# Patient Record
Sex: Female | Born: 1935 | Race: White | Hispanic: Yes | Smoking: Never smoker
Health system: Southern US, Community
[De-identification: ages and names within clinical notes are randomized; demographics above are authoritative.]

## PROBLEM LIST (undated history)

## (undated) HISTORY — PX: TONSILLECTOMY AND ADENOIDECTOMY: SHX28

## (undated) HISTORY — PX: TONSILLECTOMY: SUR1361

## (undated) HISTORY — PX: APPENDECTOMY: SHX54

---

## 1997-11-28 ENCOUNTER — Ambulatory Visit (HOSPITAL_COMMUNITY): Admission: RE | Admit: 1997-11-28 | Discharge: 1997-11-28 | Payer: Self-pay | Admitting: Obstetrics & Gynecology

## 2000-01-24 ENCOUNTER — Encounter: Admission: RE | Admit: 2000-01-24 | Discharge: 2000-01-24 | Payer: Self-pay | Admitting: Family Medicine

## 2000-01-24 ENCOUNTER — Encounter: Payer: Self-pay | Admitting: Family Medicine

## 2000-02-05 ENCOUNTER — Encounter: Payer: Self-pay | Admitting: Family Medicine

## 2000-02-05 ENCOUNTER — Encounter: Admission: RE | Admit: 2000-02-05 | Discharge: 2000-02-05 | Payer: Self-pay | Admitting: Family Medicine

## 2000-02-07 ENCOUNTER — Encounter: Admission: RE | Admit: 2000-02-07 | Discharge: 2000-02-07 | Payer: Self-pay | Admitting: Family Medicine

## 2000-02-07 ENCOUNTER — Encounter: Payer: Self-pay | Admitting: Family Medicine

## 2000-02-20 ENCOUNTER — Ambulatory Visit (HOSPITAL_COMMUNITY): Admission: RE | Admit: 2000-02-20 | Discharge: 2000-02-20 | Payer: Self-pay | Admitting: *Deleted

## 2000-03-13 ENCOUNTER — Ambulatory Visit (HOSPITAL_COMMUNITY): Admission: RE | Admit: 2000-03-13 | Discharge: 2000-03-13 | Payer: Self-pay | Admitting: *Deleted

## 2001-08-26 ENCOUNTER — Other Ambulatory Visit: Admission: RE | Admit: 2001-08-26 | Discharge: 2001-08-26 | Payer: Self-pay | Admitting: *Deleted

## 2005-04-16 ENCOUNTER — Encounter: Admission: RE | Admit: 2005-04-16 | Discharge: 2005-04-16 | Payer: Self-pay | Admitting: Family Medicine

## 2005-05-28 ENCOUNTER — Encounter: Admission: RE | Admit: 2005-05-28 | Discharge: 2005-05-28 | Payer: Self-pay | Admitting: *Deleted

## 2006-04-13 ENCOUNTER — Ambulatory Visit: Payer: Self-pay | Admitting: Family Medicine

## 2006-06-05 ENCOUNTER — Ambulatory Visit: Payer: Self-pay | Admitting: Family Medicine

## 2006-06-29 ENCOUNTER — Ambulatory Visit: Payer: Self-pay | Admitting: Family Medicine

## 2006-07-13 ENCOUNTER — Ambulatory Visit: Payer: Self-pay | Admitting: Family Medicine

## 2006-11-02 ENCOUNTER — Ambulatory Visit: Payer: Self-pay | Admitting: Family Medicine

## 2007-03-01 ENCOUNTER — Ambulatory Visit: Payer: Self-pay | Admitting: Family Medicine

## 2007-04-29 ENCOUNTER — Ambulatory Visit: Payer: Self-pay | Admitting: Family Medicine

## 2007-07-15 ENCOUNTER — Ambulatory Visit: Payer: Self-pay | Admitting: Family Medicine

## 2007-09-28 ENCOUNTER — Ambulatory Visit: Payer: Self-pay | Admitting: Family Medicine

## 2007-10-27 ENCOUNTER — Ambulatory Visit: Payer: Self-pay | Admitting: Family Medicine

## 2007-11-22 ENCOUNTER — Ambulatory Visit: Payer: Self-pay | Admitting: Family Medicine

## 2007-11-23 ENCOUNTER — Encounter: Admission: RE | Admit: 2007-11-23 | Discharge: 2007-11-23 | Payer: Self-pay | Admitting: Family Medicine

## 2007-12-24 ENCOUNTER — Ambulatory Visit: Payer: Self-pay | Admitting: Family Medicine

## 2007-12-24 ENCOUNTER — Encounter: Admission: RE | Admit: 2007-12-24 | Discharge: 2007-12-24 | Payer: Self-pay | Admitting: Family Medicine

## 2008-03-27 ENCOUNTER — Ambulatory Visit: Payer: Self-pay | Admitting: Family Medicine

## 2008-05-05 ENCOUNTER — Ambulatory Visit: Payer: Self-pay | Admitting: Family Medicine

## 2008-06-19 ENCOUNTER — Ambulatory Visit: Payer: Self-pay | Admitting: Family Medicine

## 2009-03-15 ENCOUNTER — Ambulatory Visit: Payer: Self-pay | Admitting: Family Medicine

## 2009-12-16 IMAGING — CT CT HEAD WO/W CM
1 of 2 series · 13 of 30 positions shown, 17 images · IV contrast (agent unspecified)
Comparison: None.

CLINICAL DATA: 89-year-old female with double vision status post
fall on 11/18/2007 in which she struck her left frontal area.

CT HEAD WITHOUT AND WITH CONTRAST
TECHNIQUE: Contiguous axial images were obtained from the base of
the skull through the vertex without and with intravenous contrast.
Contrast: 75 ml [id number].

[Series 32: 3d filtered head w/o · axial · non-contrast · 0.45mm/px · z∈[+19,+145]mm · 13 of 28 slices shown, 17 images]
[im 2/28  brain]
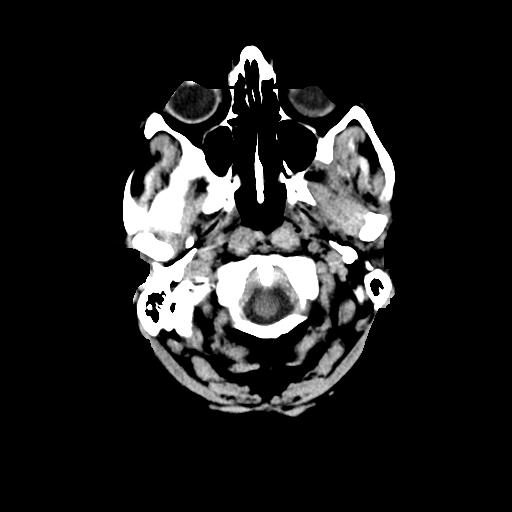
[im 2/28  bone]
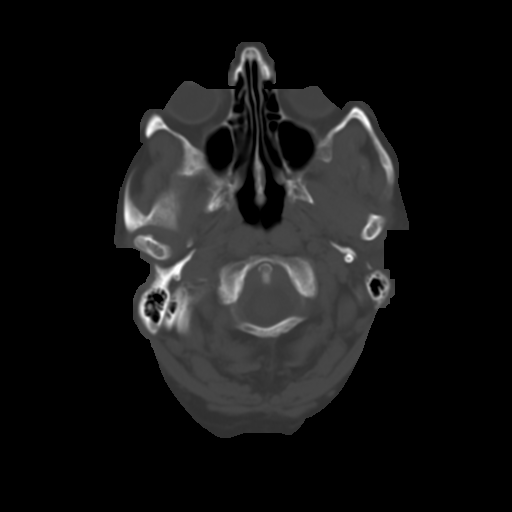
[im 4/28  brain]
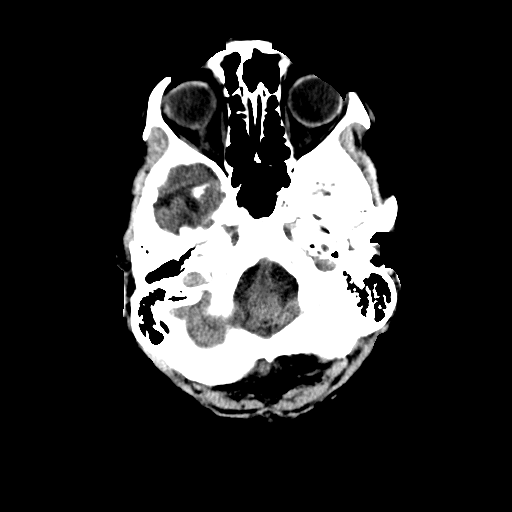
[im 6/28  brain]
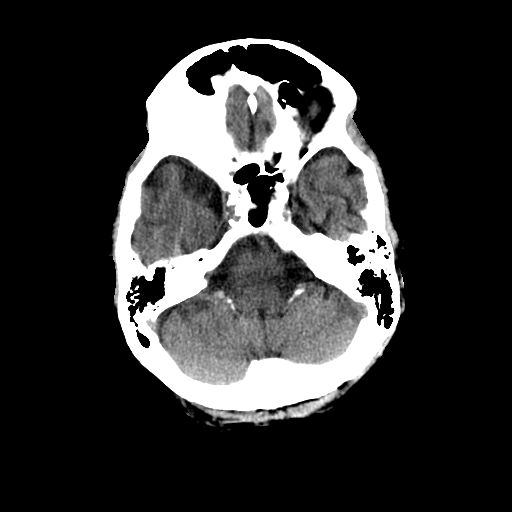
[im 8/28  brain]
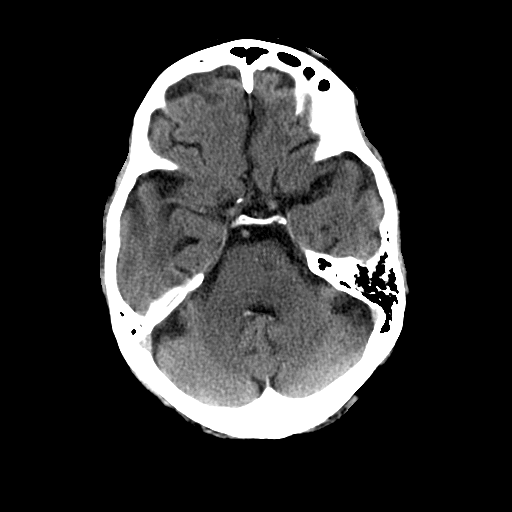
[im 10/28  brain]
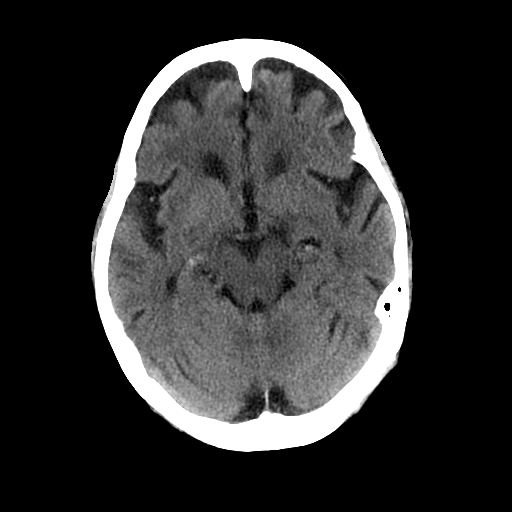
[im 10/28  bone]
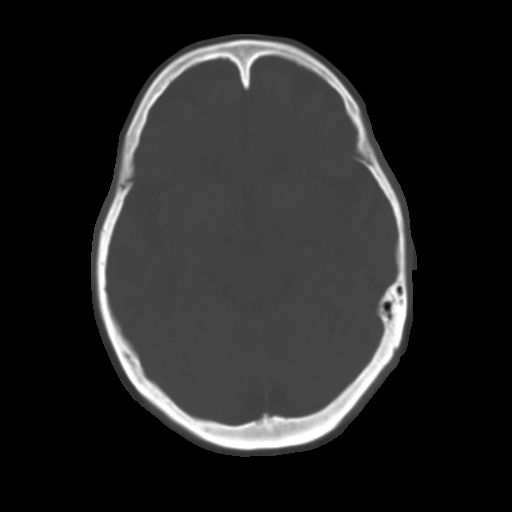
[im 12/28  brain]
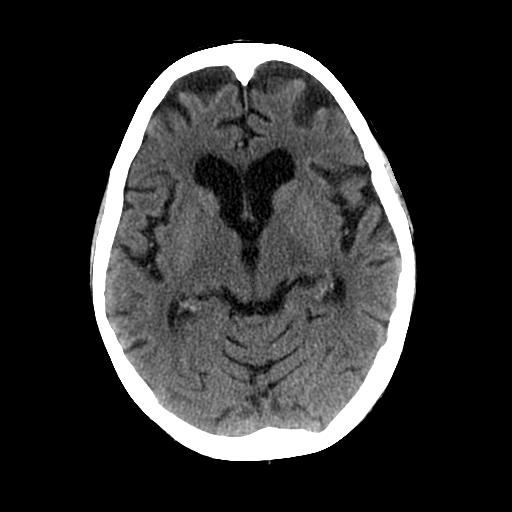
[im 14/28  brain]
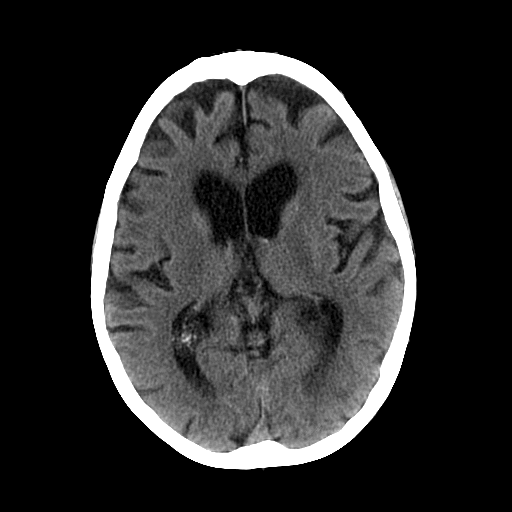
[im 16/28  brain]
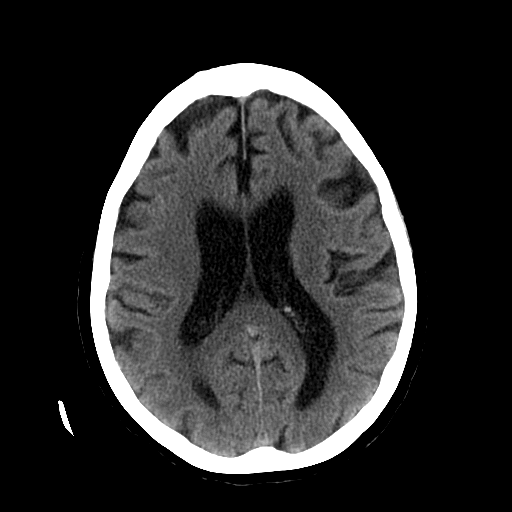
[im 18/28  brain]
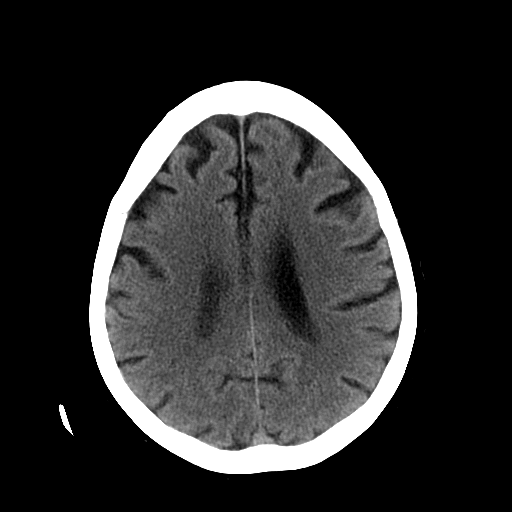
[im 18/28  bone]
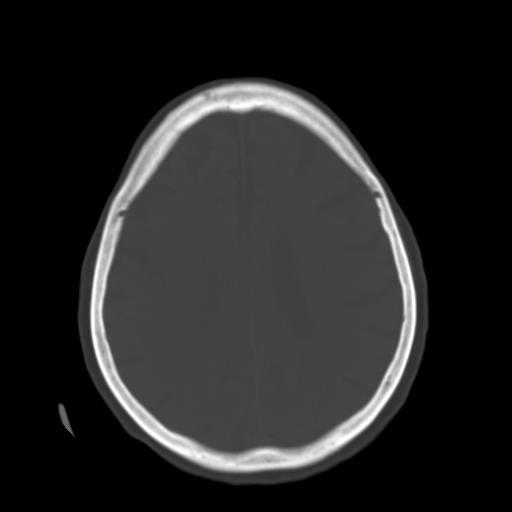
[im 20/28  brain]
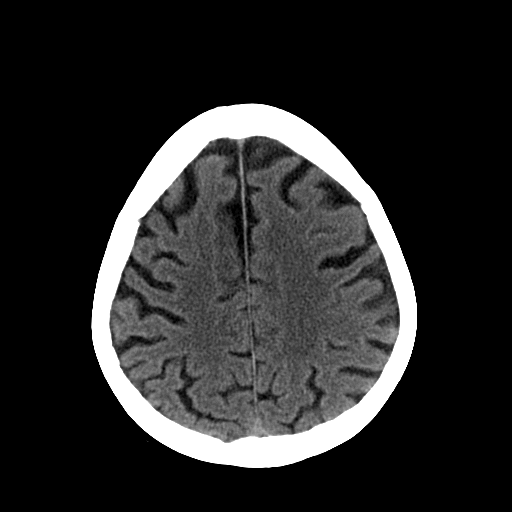
[im 22/28  brain]
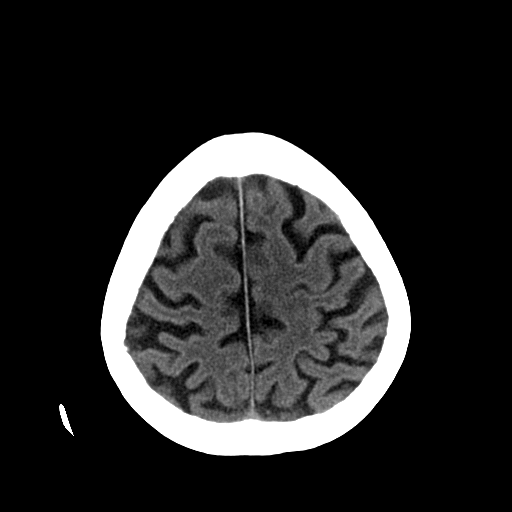
[im 24/28  brain]
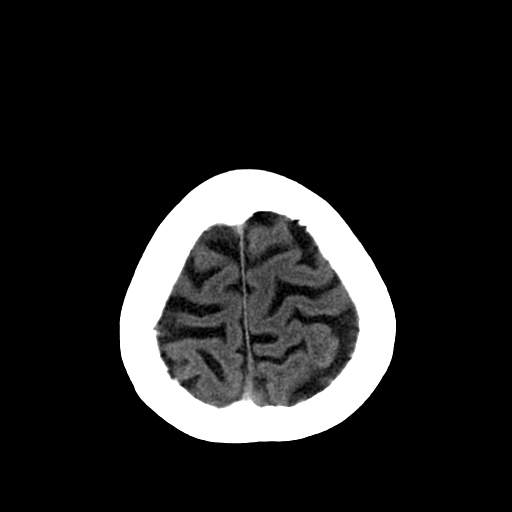
[im 26/28  brain]
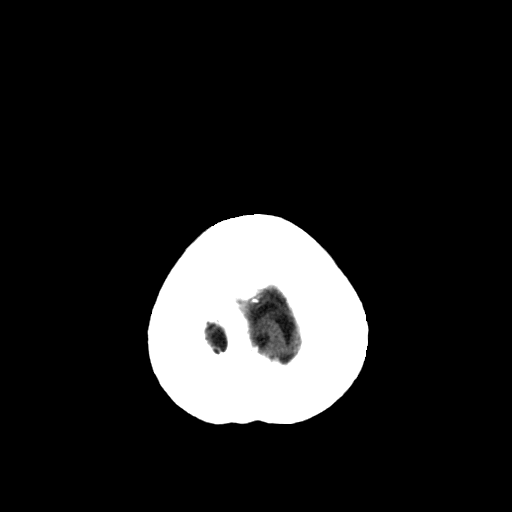
[im 26/28  bone]
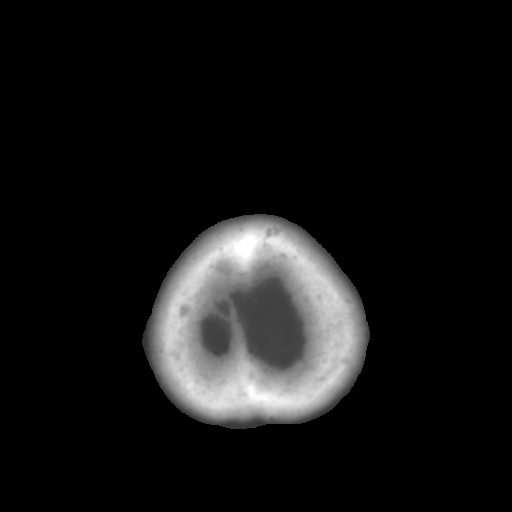

[13 of 30 positions shown; findings below may reference images not displayed]

FINDINGS: Postoperative changes to the globes.  The gaze appears
conjugate on these images.  The visualized scalp soft tissues are
within normal limits, no focal scalp hematoma is identified.
Visualized paranasal sinuses are clear.  Visualized mastoids are
clear.  No acute osseous abnormality identified.

Normal cerebral volume for age without midline shift,
ventriculomegaly, mass effect, acute intracranial hemorrhage or
evidence of acute cortically based infarct.  Chronic carotid siphon
vascular calcifications.  Mild hypodensity in the cerebral white
matter compatible with chronic small vessel disease.  Otherwise
normal gray-white matter differentiation throughout the brain.  No
abnormal enhancement.
IMPRESSION: 1.  No acute traumatic injury identified.
2.  Unremarkable brain for age.

## 2009-12-28 ENCOUNTER — Ambulatory Visit: Payer: Self-pay | Admitting: Family Medicine

## 2010-01-24 ENCOUNTER — Ambulatory Visit: Payer: Self-pay | Admitting: Family Medicine

## 2011-01-10 NOTE — Op Note (Signed)
[place]  Patient:    Gabrielle Simmons, Gabrielle Simmons                  MRN: [id number] Proc. Date: 03/13/00 Adm. Date:  [id number] Disc. Date: [id number] Attending:  Sharyn Dross CC:         Ronnald Nian, M.D.                           Operative Report  REFERRING PHYSICIAN:  Ronnald Nian, M.D.  PREOPERATIVE DIAGNOSES: 1.  Abdominal pains. 2.  Family history of colon cancer.  POSTOPERATIVE DIAGNOSIS:  Tortuous colon but normal to the cecal region.  PROCEDURE:  Colonoscopy.  MEDICATION:  Demerol 50 mg IV, Versed 2.5 mg IV over 10 minute period of time.  INSTRUMENTS:  The Olympus video pan colonoscope.  ENDOSCOPIST:  Sharyn Dross., M.D.  INDICATIONS:  This pleasant 77 year old female was referred for an evaluation because of specific abdominal and pelvic pain that is present.  The pains appear to be in the infraumbilical region that was present.  She has had a workup from the urology standpoint, which was negative.  She was subsequently referred to rule out any evidence of GI problems.  She denies any history of any bleeding per rectum at this time.  She states that her mother has had colon cancer many years ago.  She has not been checked in the past, and because of that history, the patient was brought in for the colonoscopic examination.  OBJECTIVE FINDINGS:  She is a pleasant female who appears to be in no acute distress today.  Her vital signs are stable.  Her HEENT examination is anicteric.  The neck is supple.  Lungs are clear.  Heart has a regular rate and rhythm without heaves, thrills, murmurs, rubs or gallops.  The abdomen was soft, mild tenderness, generalized, especially in the infraumbilical region on the right side and left.  No rebound or referred tenderness is noted at this time.  Digital rectal exam was normal.  Extremities showed no cyanosis, clubbing or edema.  PLAN:  Were going to proceed with the colonoscopic  examination.  INFORMED CONSENT:  The patient was advised of the procedure, indication and risks involved.  She has agreed to have the procedure performed.  Video was reviewed and consent form obtained.  PREOPERATIVE PREPARATION:  The patient was brought to the endoscopy unit where an IV for IV sedative medication was started.  A monitor was placed on the patient to monitor the patients vital signs and oxygen saturation.  Nasal oxygen at 2 liters per minute was used, sedation was performed, and the procedure was begun.  The patient was given GoLYTELY and Reglan as a bowel prep.  The patient tolerated the prep well without any complications.  The quality of the prep was good to excellent.  PROCEDURE IN DETAIL:  The instrument was advanced with the patient lying in the left lateral position approximately 95 cm proximal colon to the cecal region.  This was confirmed by palpation as well as visualization of the ileocecal valve.  There appeared to be no gross abnormality such as masses, polyps, or stricture lesions appreciated.  The vasculature of the abdomen appeared to be well within normal limits throughout the entire colon.  The mucosal pattern showed no evidence of any diverticular changes or any granular changes that were noted at this time.  There was increased tortuosity in advancing  the instrument, especially in the rectosigmoid and descending colon at this time.  However, with manipulation of the instrument I was able to traverse all the way to the cecal region with no difficulty at this time.  There was evidence of increased hyperemia around the anal verge upon exiting from the area, but no direct overt hemorrhoids that were appreciated at this time.  The instrument was subsequently removed and the patient tolerated the procedure without any complications.  TREATMENT: 1.  Conservative management at this time. 2.  Agree with the high fiber that she is presently taking at  this time. 3.  Will have followup in the office at this point. 4.  We would recommend to repeat the examination in approximately two to     possibly three years. DD:  03/13/00 TD:  03/15/00 Job: [id number]

## 2013-08-12 ENCOUNTER — Encounter: Payer: Self-pay | Admitting: Podiatrist

## 2013-08-12 ENCOUNTER — Ambulatory Visit: Payer: Self-pay | Admitting: Podiatrist

## 2013-08-12 ENCOUNTER — Ambulatory Visit (INDEPENDENT_AMBULATORY_CARE_PROVIDER_SITE_OTHER): Payer: Medicare Other | Admitting: Podiatrist

## 2013-08-12 VITALS — BP 130/74 | HR 74 | Resp 12

## 2013-08-12 DIAGNOSIS — M79609 Pain in unspecified limb: Secondary | ICD-10-CM

## 2013-08-12 DIAGNOSIS — B351 Tinea unguium: Secondary | ICD-10-CM

## 2013-08-16 NOTE — Progress Notes (Signed)
HPI:  Patient presents today for follow up of foot and nail care. Denies any new complaints today.  Objective:  Patients chart is reviewed.  Neurovascular status unchanged.  Patients nails are thickened, discolored, distrophic, friable and brittle with yellow-brown discoloration. Patient subjectively relates they are painful with shoes and with ambulation of bilateral feet.  Assessment:  Symptomatic onychomycosis  Plan:  Discussed treatment options and alternatives.  The symptomatic toenails were debrided through manual an mechanical means without complication.  Return appointment recommended at routine intervals of 3 months    Kathryn Egerton, DPM   

## 2015-09-13 ENCOUNTER — Encounter (HOSPITAL_COMMUNITY): Payer: Self-pay | Admitting: Emergency Medicine

## 2015-09-13 ENCOUNTER — Emergency Department (HOSPITAL_COMMUNITY)
Admission: EM | Admit: 2015-09-13 | Discharge: 2015-09-13 | Disposition: A | Discharge status: Home / Self Care | Payer: Medicare Other | Attending: Emergency Medicine | Admitting: Emergency Medicine

## 2015-09-13 ENCOUNTER — Emergency Department (HOSPITAL_COMMUNITY): Payer: Medicare Other

## 2015-09-13 DIAGNOSIS — Y998 Other external cause status: Secondary | ICD-10-CM | POA: Diagnosis not present

## 2015-09-13 DIAGNOSIS — S42491A Other displaced fracture of lower end of right humerus, initial encounter for closed fracture: Secondary | ICD-10-CM | POA: Insufficient documentation

## 2015-09-13 DIAGNOSIS — S4991XA Unspecified injury of right shoulder and upper arm, initial encounter: Secondary | ICD-10-CM | POA: Diagnosis present

## 2015-09-13 DIAGNOSIS — W1839XA Other fall on same level, initial encounter: Secondary | ICD-10-CM | POA: Diagnosis not present

## 2015-09-13 DIAGNOSIS — Z79899 Other long term (current) drug therapy: Secondary | ICD-10-CM | POA: Diagnosis not present

## 2015-09-13 DIAGNOSIS — Y9389 Activity, other specified: Secondary | ICD-10-CM | POA: Diagnosis not present

## 2015-09-13 DIAGNOSIS — Y9289 Other specified places as the place of occurrence of the external cause: Secondary | ICD-10-CM | POA: Diagnosis not present

## 2015-09-13 DIAGNOSIS — S42401A Unspecified fracture of lower end of right humerus, initial encounter for closed fracture: Secondary | ICD-10-CM

## 2015-09-13 MED ORDER — OXYCODONE-ACETAMINOPHEN 5-325 MG PO TABS
1.0000 | ORAL_TABLET | Freq: Once | ORAL | Status: AC
  Administered 2015-09-13: 1 via ORAL
  Filled 2015-09-13: qty 1

## 2015-09-13 MED ORDER — OXYCODONE-ACETAMINOPHEN 5-325 MG PO TABS: 1.0000 | ORAL_TABLET | Freq: Four times a day (QID) | ORAL | Status: DC | PRN

## 2015-09-13 NOTE — ED Notes (Signed)
Pt d/ced and waiting ptar for tx to residence

## 2015-09-13 NOTE — ED Notes (Signed)
[facility] called for transport.  

## 2015-09-13 NOTE — ED Notes (Signed)
PA at bedside.

## 2015-09-13 NOTE — Progress Notes (Addendum)
09/13/15 0000  CM Assessment  Expected Discharge Plan Home w Home Health Services  Discharge Planning Services CM Consult  PAC Choice Durable Medical Equipment;Home Health  Choice offered to / list presented to  Patient;Adult Children  DME Arranged [id number];Wheelchair manual  DME Agency [facility]  HH Arranged RN;PT  HH Agency [facility]  Status of Service Completed, signed off  Discharge Disposition Home w Home Health Services    Cm noted Cm consult  Cm met with pt and daughter at bedside Daughter is primary caregiver whom pt lives with Pt has only cane and walker at home Pt is right hand dominant which is the injured arm therefore agreed to a wheelchair and 3N1 Has transverse supracondylar fracture of the distal humerus with an associated elbow joint effusion  Daughter interested in services to assist Agreed to home health services Choice is [facility] because they have used them before Reports pt is seen by doctors making house calls  Cm also discussed community transportation options but unable to find local transportation services to come in to get pt to walk her down steps Richard from [facility] [place] was consulted who states many of the PCS (personal care services) agencies like [employer] and home watch care CNAs are trained to assist pt out and in homes using stairs Daughter updated  Daughter provided information on [facility] [place], PDN and HH agencies  CM reviewed in details medicare guidelines, home health (HH) (length of stay in home, types of HH staff available, coverage, primary caregiver, up to 24 hrs before services may be started) and Private duty nursing (PDN-coverage, length of stay in the home types of staff available).   CM left her office Number for daughter to call if issues  Daughter wants w/c ans 3n1 delivered to the home  CM left message for Tim of [facility] for referral for HHRN/HHPT  Cm spoke with Samantha ED NP/PA about  need for face to face and pt d/c plans agreed upon by pt and Dtr    

## 2015-09-13 NOTE — Progress Notes (Signed)
  Patient suffers from transverse supracondylar fracture of the distal humerus with an associated elbow joint effusion which impairs their ability to perform daily activities like mobility, eating, bathing and dressing in the home.  A cane, crutch or walker will not resolve  issue with performing activities of daily living since she is right hand dominant and has right hand injury. A wheelchair will allow patient to safely perform daily activities. Patient can safely propel the wheelchair in the home or has a caregiver who can provide assistance.  Accessories: elevating leg rests (ELRs), wheel locks, extensions and anti-tippers.

## 2015-09-13 NOTE — ED Notes (Signed)
Pt's daughter noted to be asking Adriana Simas MD about getting the Pt getting "an ambulance faster."  Daughter informed that the EDPs do not deal w/ Pt transport and that they would be here asap.  Additionally, informed that it could be a long wait.

## 2015-09-13 NOTE — ED Provider Notes (Signed)
CSN: [id number]     Arrival date & time 09/13/15  1428 History   First MD Initiated Contact with Patient 09/13/15 1510     Chief Complaint  Patient presents with  . Arm Pain   HPI  Gabrielle Simmons is a 82 y.o. F presenting s/p mechanical fall last night around 9 pm. EMS was called but the patient refused transport. This morning, she woke up with right arm pain. She describes the pain as discomfort, elbow in location, non-radiating, 6/10 pain scale, constant, worsened with movement. No alleviation attempts. No LOC, head injury, HA, CP, abdominal pain, change in bowel/bladder habits, bowel/bladder incontinence.   History provided by daughter, present at bedside.  History reviewed. No pertinent past medical history. Past Surgical History  Procedure Laterality Date  . Cesarean section    . Appendectomy    . Tonsillectomy    . Tonsillectomy and adenoidectomy     History reviewed. No pertinent family history. Social History  Substance Use Topics  . Smoking status: Never Smoker   . Smokeless tobacco: None  . Alcohol Use: No   OB History    No data available     Review of Systems  Ten systems are reviewed and are negative for acute change except as noted in the HPI  Allergies  Review of patient's allergies indicates no known allergies.  Home Medications   Prior to Admission medications   Medication Sig Start Date End Date Taking? Authorizing Provider  escitalopram (LEXAPRO) 10 MG tablet  08/09/13   Historical Provider, MD  Naproxen Sodium (ALEVE PO) Take by mouth.    Historical Provider, MD  oxybutynin (DITROPAN-XL) 5 MG 24 hr tablet  08/11/13   Historical Provider, MD  Vitamin D, Ergocalciferol, (DRISDOL) 50000 UNITS CAPS capsule  08/06/13   Historical Provider, MD   BP 144/59 mmHg  Pulse 70  Temp(Src) 98.6 F (37 C) (Oral)  Resp 20  SpO2 96% Physical Exam  Constitutional: She appears well-developed and well-nourished. No distress.  HENT:  Head: Normocephalic and  atraumatic.  Mouth/Throat: Oropharynx is clear and moist. No oropharyngeal exudate.  Eyes: Conjunctivae are normal. Pupils are equal, round, and reactive to light. Right eye exhibits no discharge. Left eye exhibits no discharge. No scleral icterus.  Neck: No tracheal deviation present.  Cardiovascular: Normal rate, regular rhythm, normal heart sounds and intact distal pulses.  Exam reveals no gallop and no friction rub.   No murmur heard. Pulmonary/Chest: Effort normal and breath sounds normal. No respiratory distress. She has no wheezes. She has no rales. She exhibits no tenderness.  Abdominal: Soft. Bowel sounds are normal. She exhibits no distension and no mass. There is no tenderness. There is no rebound and no guarding.  Musculoskeletal: She exhibits edema and tenderness.  Slight edema surrounding right elbow. No ecchymosis. ROM limited at right arm due to pain. Neurovascularly intact BL.   Lymphadenopathy:    She has no cervical adenopathy.  Neurological: She is alert. Coordination normal.  Skin: Skin is warm and dry. No rash noted. She is not diaphoretic. No erythema.  Psychiatric: She has a normal mood and affect. Her behavior is normal.  Nursing note and vitals reviewed.   ED Course  Procedures  Imaging Review Dg Humerus Right  09/13/2015  CLINICAL DATA:  Right arm pain following a fall at home last night. EXAM: RIGHT HUMERUS - 2+ VIEW COMPARISON:  None. FINDINGS: Diffuse osteopenia. Transverse supracondylar fracture of the distal humerus with anterior angulation of the distal fragment.  There is an associated elbow joint effusion. Right shoulder degenerative changes. IMPRESSION: Transverse supracondylar fracture of the distal humerus with an associated elbow joint effusion. Electronically Signed   By: Beckie Salts M.D.   On: 09/13/2015 14:57   I have personally reviewed and evaluated these images and lab results as part of my medical decision-making.  MDM   Final diagnoses:   Humerus distal fracture, right, closed, initial encounter   Patient non-toxic appearing and VSS.  Right humerus xray performed prior to my evaluation, which demonstrates a transverse supracondylar fracture of the right distal humerus with associated elbow joint effusion. No further imaging indicated at this time.   I explained the results of the xray to the patient, and her daughter, and the daughter informed me that the patient will not be able to go to an ortho follow-up because she has difficulty ambulating outside of the home. She states her PCP comes to the home to evaluate Gabrielle Simmons. She also informed me that the patient is a DNR and would not agree to ortho surgery. I explained that ortho would most likely advise surgery. My plan is to treat her pain, splint for comfort, and consult case management. Dr. Adriana Simas evaluated patient as well and agrees with my plan.  Patient with no pain on reassessment. Case management provided patient with resources. Patient may be safely discharged home with percocet. Discussed reasons for return. Patient to call ortho tomorrow, if possible. Patient should follow-up with PCP as well. Patient's daughter in understanding and agreement with the plan.  Melton Krebs, PA-C 09/15/15 1445  Donnetta Hutching, MD 09/15/15 [phone]

## 2015-09-13 NOTE — ED Notes (Signed)
[facility] called for Pt 

## 2015-09-13 NOTE — ED Notes (Signed)
P-tar here to transport pt home.  Called pt's daughter, Byrd Hesselbach, [phone], left message pt is on her way home, P-tar has arrived.

## 2015-09-13 NOTE — ED Notes (Signed)
Per EMS: Pt from home.  Last night, she had a fall around 9pm.  Called 911.  Refused transport.  Woke up with rt arm pain.  Home health came.  Did a portable xray and found a fx to distal humerus.

## 2015-09-24 ENCOUNTER — Telehealth: Payer: Self-pay | Admitting: *Deleted

## 2019-11-15 ENCOUNTER — Telehealth: Payer: Self-pay | Admitting: Internal Medicine

## 2019-11-15 NOTE — Telephone Encounter (Signed)
Phone call placed to patient to offer to schedule a visit with [facility]. Phone rang, with no answer I left a voicemail for call back. 

## 2019-12-06 ENCOUNTER — Telehealth: Payer: Self-pay

## 2019-12-06 NOTE — Telephone Encounter (Signed)
Phone call placed to patient to introduce Palliative Care and offer to schedule visit with NP. VM left with callback information

## 2019-12-12 ENCOUNTER — Telehealth: Payer: Self-pay

## 2019-12-12 NOTE — Telephone Encounter (Signed)
Call placed to patient. Spoke with daughter, Gabrielle Simmons, to introduce Palliative Care. Daughter receptive to scheduling visit. Verbal consent obtained. Visit scheduled for Monday 12/19/19 @ 3pm. Address verified.

## 2019-12-18 NOTE — Progress Notes (Addendum)
April 26th, 2021 [facility]iative [facility] Consult Note Telephone: [phone]  Fax: [phone]  PATIENT NAME: Gabrielle Simmons DOB: 1918-01-20 MRN: [id number]  [address]  [place] [id number]  PRIMARY CARE PROVIDER:   Florentina Jenny, MD  REFERRING PROVIDER:  Florentina Jenny, MD [phone] [place],  [place] [address]  RESPONSIBLE PARTY:  Ashok Cordia  [phone] [facility] Phone)   ASSESSMENT / RECOMMENDATIONS:  1. Advance Care Planning: A. Directives: Discussed with daughter/HCPOA  Byrd Hesselbach. No desire for cardiopulmonary resuscitation in the event of a Cardiopulmonary arrest. DNR completed and 2 copies left in the home. Form uploaded into [facility] [employer]. B. Goals of Care: Avoid hospitalizations. Maximize comfort happiness.  2. Symptom Management: Patient is A & O x 3, and cognizant of current events. Daughter notes patient now speaks almost exclusively her native [place], rather than [place]. She has experienced functional decline over the last month. She can be agitated with attempts to provide some personal care such as nail and toenail clipping. She is fearful when crossing the threshold into the bathroom; this is where she fell years ago and fractured her elbow. Patient is fearful of traveling outside the home. Patient now needing assist to transfer. She needs guarding assist when ambulation with her walker. She is now incontinent of both bowel and bladder. Increased somnolence such that she is awake only about 5 hours/day. She is dependent for hygiene, dressing, and toileting.  Her appetite has declined; consumes average of 50% of two meals/day, with an ensure in the evenings. Daughter more often now assisting patient with feeding her meals. Height 5". Her last measured weight last year was around160lbs.Daughter feels patient has lost at least 10 lbs, with noticeable facial and upper extremity adipose/muscular wasting.   Arthritic pain in  her lower back/shoulders/knees well managed with prn Tylenol.  Daughter relates episode when, upon returning from the market daughter discovered patient sitting at the table with her head on the table surface. Patient related she fell, and a neighbor assisted her back to her seat. Daughter not able to validate if this happened, or was a delusion.   3. Family Supports: Patient is widowed since 01/01/65, shortly after arriving to the [place] from [place]. 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 She has lived with her daughter Byrd Hesselbach since 2004/01/02. Patient's son is deceased.   Patient receives a shower twice a week from a privately hired aide.  Daughter requesting contact information for Meals on Wheels. I provided her with the [facility] 211 number to resource.   4. Follow up Palliative Care Visit: Daughter will call me on a prn basis, signs of further patient decline. I will check with Dr. Gibson Ramp [facility] physician) if patient has shown enough signs of decline over this last month, for consideration of hospice eligibility.  I spent 60 minutes providing this consultation from 3pm to 4pm. More than 50% of the time in this consultation was spent coordinating communication.   HISTORY OF PRESENT ILLNESS:  Gabrielle DENUNZIO is a 86 y.o.  female with h/o Alzheimer's Dementia, PVD, arthritis. She was recently evaluated for hospice, but did not meet criteria as her prognosis thought greater than 6 months.  Palliative Care was asked to help address goals of care.   CODE STATUS: DNR  PPS: 30%  HOSPICE ELIGIBILITY/DIAGNOSIS: TBD PAST MEDICAL HISTORY: No past medical history on file.  SOCIAL HX:  Social History   Tobacco Use  . Smoking status: Never Smoker  Substance Use Topics  . Alcohol use: No    ALLERGIES: No Known Allergies   PERTINENT MEDICATIONS:  Outpatient Encounter Medications as of 12/19/2019  Medication Sig  . mirtazapine (REMERON) 15 MG tablet Take 15 mg by mouth at bedtime.  . [DISCONTINUED]  escitalopram (LEXAPRO) 10 MG tablet   . [DISCONTINUED] Naproxen Sodium (ALEVE PO) Take by mouth.  . [DISCONTINUED] oxybutynin (DITROPAN-XL) 5 MG 24 hr tablet   . [DISCONTINUED] oxyCODONE-acetaminophen (PERCOCET/ROXICET) 5-325 MG tablet Take 1-2 tablets by mouth every 6 (six) hours as needed for severe pain. (Patient not taking: Reported on 12/19/2019)  . [DISCONTINUED] Vitamin D, Ergocalciferol, (DRISDOL) 50000 UNITS CAPS capsule    No facility-administered encounter medications on file as of 12/19/2019.    PHYSICAL EXAM:  Well nourished, pleasant elderly female. Maintaining good eye contact. Speaking mix of Spanish, some [place].  Cardiovascular: regular rate and rhythm Pulmonary: clear ant fields Abdomen: soft, nontender, + bowel sounds GU: no suprapubic tenderness Extremities: mild non-pilling edema to above ankles, no joint deformities Skin: no rashes Neurological: Weakness but otherwise nonfocal  Julianne Handler, NP

## 2019-12-19 ENCOUNTER — Other Ambulatory Visit: Payer: Self-pay

## 2019-12-19 ENCOUNTER — Other Ambulatory Visit: Payer: Medicare PPO | Admitting: Internal Medicine

## 2019-12-19 DIAGNOSIS — Z515 Encounter for palliative care: Secondary | ICD-10-CM

## 2019-12-19 DIAGNOSIS — Z7189 Other specified counseling: Secondary | ICD-10-CM

## 2019-12-19 NOTE — Addendum Note (Signed)
Addended by: Holly Bodily on: 12/19/2019 09:01 PM   Modules accepted: Orders

## 2020-09-25 DEATH — deceased
